# Patient Record
Sex: Female | Born: 1995 | Hispanic: No | Marital: Single | Smoking: Never smoker
Health system: Southern US, Community
[De-identification: ages and names within clinical notes are randomized; demographics above are authoritative.]

## PROBLEM LIST (undated history)

## (undated) HISTORY — PX: TONSILLECTOMY AND ADENOIDECTOMY: SUR1326

---

## 2015-07-20 ENCOUNTER — Encounter: Payer: Self-pay | Admitting: Urgent Care

## 2015-07-20 ENCOUNTER — Ambulatory Visit (INDEPENDENT_AMBULATORY_CARE_PROVIDER_SITE_OTHER): Payer: Commercial Managed Care - HMO | Admitting: Urgent Care

## 2015-07-20 ENCOUNTER — Ambulatory Visit (INDEPENDENT_AMBULATORY_CARE_PROVIDER_SITE_OTHER): Payer: Commercial Managed Care - HMO

## 2015-07-20 VITALS — BP 98/60 | HR 75 | Temp 97.4°F | Resp 16 | Ht 62.0 in | Wt 128.0 lb

## 2015-07-20 DIAGNOSIS — M542 Cervicalgia: Secondary | ICD-10-CM | POA: Diagnosis not present

## 2015-07-20 DIAGNOSIS — M6248 Contracture of muscle, other site: Secondary | ICD-10-CM | POA: Diagnosis not present

## 2015-07-20 DIAGNOSIS — M62838 Other muscle spasm: Secondary | ICD-10-CM

## 2015-07-20 MED ORDER — CYCLOBENZAPRINE HCL 5 MG PO TABS: 5.0000 mg | ORAL_TABLET | Freq: Three times a day (TID) | ORAL | Status: AC | PRN

## 2015-07-20 MED ORDER — MELOXICAM 7.5 MG PO TABS: 7.5000 mg | ORAL_TABLET | Freq: Every day | ORAL | Status: AC
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
## 2015-07-20 NOTE — Patient Instructions (Signed)

## 2018-04-23 DIAGNOSIS — R5383 Other fatigue: Secondary | ICD-10-CM | POA: Diagnosis not present

## 2018-04-23 DIAGNOSIS — Z7289 Other problems related to lifestyle: Secondary | ICD-10-CM | POA: Diagnosis not present

## 2018-04-23 DIAGNOSIS — R634 Abnormal weight loss: Secondary | ICD-10-CM | POA: Diagnosis not present

## 2018-06-11 DIAGNOSIS — Z01419 Encounter for gynecological examination (general) (routine) without abnormal findings: Secondary | ICD-10-CM | POA: Diagnosis not present

## 2018-06-26 DIAGNOSIS — G43809 Other migraine, not intractable, without status migrainosus: Secondary | ICD-10-CM | POA: Diagnosis not present

## 2018-09-07 DIAGNOSIS — R52 Pain, unspecified: Secondary | ICD-10-CM | POA: Diagnosis not present

## 2018-09-07 DIAGNOSIS — Z6821 Body mass index (BMI) 21.0-21.9, adult: Secondary | ICD-10-CM | POA: Diagnosis not present

## 2018-09-07 DIAGNOSIS — B349 Viral infection, unspecified: Secondary | ICD-10-CM | POA: Diagnosis not present

## 2018-11-28 DIAGNOSIS — N760 Acute vaginitis: Secondary | ICD-10-CM | POA: Diagnosis not present

## 2018-11-28 DIAGNOSIS — N926 Irregular menstruation, unspecified: Secondary | ICD-10-CM | POA: Diagnosis not present

## 2018-11-28 DIAGNOSIS — R309 Painful micturition, unspecified: Secondary | ICD-10-CM | POA: Diagnosis not present

## 2018-12-12 DIAGNOSIS — Z6821 Body mass index (BMI) 21.0-21.9, adult: Secondary | ICD-10-CM | POA: Diagnosis not present

## 2018-12-12 DIAGNOSIS — Z01419 Encounter for gynecological examination (general) (routine) without abnormal findings: Secondary | ICD-10-CM | POA: Diagnosis not present

## 2019-01-02 DIAGNOSIS — R87612 Low grade squamous intraepithelial lesion on cytologic smear of cervix (LGSIL): Secondary | ICD-10-CM | POA: Diagnosis not present

## 2019-08-05 ENCOUNTER — Ambulatory Visit: Payer: 59 | Attending: Internal Medicine

## 2019-08-05 DIAGNOSIS — Z20822 Contact with and (suspected) exposure to covid-19: Secondary | ICD-10-CM

## 2019-08-06 LAB — NOVEL CORONAVIRUS, NAA: SARS-CoV-2, NAA: NOT DETECTED

## 2020-06-08 ENCOUNTER — Other Ambulatory Visit (HOSPITAL_BASED_OUTPATIENT_CLINIC_OR_DEPARTMENT_OTHER): Payer: Self-pay | Admitting: Registered Nurse

## 2020-06-08 ENCOUNTER — Ambulatory Visit (HOSPITAL_BASED_OUTPATIENT_CLINIC_OR_DEPARTMENT_OTHER)
Admission: RE | Admit: 2020-06-08 | Discharge: 2020-06-08 | Disposition: A | Discharge status: Home / Self Care | Payer: No Typology Code available for payment source | Source: Ambulatory Visit | Attending: Registered Nurse | Admitting: Registered Nurse

## 2020-06-08 ENCOUNTER — Other Ambulatory Visit: Payer: Self-pay

## 2020-06-08 DIAGNOSIS — D72829 Elevated white blood cell count, unspecified: Secondary | ICD-10-CM | POA: Insufficient documentation

## 2020-06-08 DIAGNOSIS — R1031 Right lower quadrant pain: Secondary | ICD-10-CM

## 2020-06-08 MED ORDER — IOHEXOL 300 MG/ML  SOLN
100.0000 mL | Freq: Once | INTRAMUSCULAR | Status: AC | PRN
  Administered 2020-06-08: 100 mL via INTRAVENOUS

## 2021-03-23 ENCOUNTER — Other Ambulatory Visit (HOSPITAL_COMMUNITY): Payer: Self-pay | Admitting: Internal Medicine

## 2021-03-23 ENCOUNTER — Other Ambulatory Visit: Payer: Self-pay | Admitting: Internal Medicine

## 2021-03-23 ENCOUNTER — Ambulatory Visit (HOSPITAL_COMMUNITY)
Admission: RE | Admit: 2021-03-23 | Discharge: 2021-03-23 | Disposition: A | Discharge status: Home / Self Care | Payer: No Typology Code available for payment source | Source: Ambulatory Visit | Attending: Internal Medicine | Admitting: Internal Medicine

## 2021-03-23 DIAGNOSIS — H5319 Other subjective visual disturbances: Secondary | ICD-10-CM

## 2021-03-23 MED ORDER — GADOBUTROL 1 MMOL/ML IV SOLN
6.0000 mL | Freq: Once | INTRAVENOUS | Status: AC | PRN
  Administered 2021-03-23: 6 mL via INTRAVENOUS

## 2021-12-24 ENCOUNTER — Emergency Department (HOSPITAL_BASED_OUTPATIENT_CLINIC_OR_DEPARTMENT_OTHER)
Admission: EM | Admit: 2021-12-24 | Discharge: 2021-12-24 | Disposition: A | Discharge status: Home / Self Care | Payer: No Typology Code available for payment source | Attending: Emergency Medicine | Admitting: Emergency Medicine

## 2021-12-24 ENCOUNTER — Encounter (HOSPITAL_BASED_OUTPATIENT_CLINIC_OR_DEPARTMENT_OTHER): Payer: Self-pay

## 2021-12-24 ENCOUNTER — Other Ambulatory Visit: Payer: Self-pay

## 2021-12-24 DIAGNOSIS — R109 Unspecified abdominal pain: Secondary | ICD-10-CM

## 2021-12-24 DIAGNOSIS — L749 Eccrine sweat disorder, unspecified: Secondary | ICD-10-CM | POA: Insufficient documentation

## 2021-12-24 DIAGNOSIS — R1030 Lower abdominal pain, unspecified: Secondary | ICD-10-CM | POA: Insufficient documentation

## 2021-12-24 DIAGNOSIS — R61 Generalized hyperhidrosis: Secondary | ICD-10-CM

## 2021-12-24 DIAGNOSIS — R110 Nausea: Secondary | ICD-10-CM | POA: Diagnosis not present

## 2021-12-24 LAB — COMPREHENSIVE METABOLIC PANEL
ALT: 16 U/L (ref 0–44)
AST: 26 U/L (ref 15–41)
Albumin: 4.7 g/dL (ref 3.5–5.0)
Alkaline Phosphatase: 47 U/L (ref 38–126)
Anion gap: 11 (ref 5–15)
BUN: 10 mg/dL (ref 6–20)
CO2: 23 mmol/L (ref 22–32)
Calcium: 9.6 mg/dL (ref 8.9–10.3)
Chloride: 105 mmol/L (ref 98–111)
Creatinine, Ser: 0.62 mg/dL (ref 0.44–1.00)
GFR, Estimated: >60 mL/min (ref >60)
Glucose, Bld: 92 mg/dL (ref 70–99)
Potassium: 3.9 mmol/L (ref 3.5–5.1)
Sodium: 139 mmol/L (ref 135–145)
Total Bilirubin: 0.5 mg/dL (ref 0.3–1.2)
Total Protein: 7.6 g/dL (ref 6.5–8.1)

## 2021-12-24 LAB — URINALYSIS, ROUTINE W REFLEX MICROSCOPIC
Bilirubin Urine: NEGATIVE
Glucose, UA: NEGATIVE mg/dL
Ketones, ur: 15 mg/dL — AB
Leukocytes,Ua: NEGATIVE
Nitrite: NEGATIVE
Specific Gravity, Urine: 1.025 (ref 1.005–1.030)
pH: 6 (ref 5.0–8.0)

## 2021-12-24 LAB — CBC
HCT: 41.8 % (ref 36.0–46.0)
Hemoglobin: 14 g/dL (ref 12.0–15.0)
MCH: 31 pg (ref 26.0–34.0)
MCHC: 33.5 g/dL (ref 30.0–36.0)
MCV: 92.5 fL (ref 80.0–100.0)
Platelets: 235 10*3/uL (ref 150–400)
RBC: 4.52 MIL/uL (ref 3.87–5.11)
RDW: 12.1 % (ref 11.5–15.5)
WBC: 9 10*3/uL (ref 4.0–10.5)
nRBC: 0 % (ref 0.0–0.2)

## 2021-12-24 LAB — PREGNANCY, URINE: Preg Test, Ur: NEGATIVE

## 2021-12-24 NOTE — ED Triage Notes (Signed)
Pt reports waking up covered in sweat for 1.5 weeks and morning nausea. Pt reports similar symptoms with a previous UTI. Pt also reports intermittent mild right lower abd pain.  ?

## 2021-12-24 NOTE — ED Provider Notes (Addendum)
?[facility] ?Provider Note ? ? ?CSN: [id number] ?Arrival date & time: 12/24/21  1047 ? ?  ? ?History ? ?Chief Complaint  ?Patient presents with  ? Night Sweats  ? ? ?Yvette Gray is a 26 y.o. female who presents emergency department complaining of "night sweats" for the past week and a half.  Patient reports waking up covered in sweat with associated morning nausea.  She reports similar symptoms with previous UTIs.  She is also complaining of intermittent right lower abdominal pain.  Recently took fluconazole for vaginal yeast infection. ? ?The history is provided by the patient.  ? ?  ? ?Home Medications ?Prior to Admission medications   ?Medication Sig Start Date End Date Taking? Authorizing Provider  ?cyclobenzaprine (FLEXERIL) 5 MG tablet Take 1-2 tablets (5-10 mg total) by mouth 3 (three) times daily as needed for muscle spasms. 07/20/15   Jaynee Eagles, PA-C  ?LARIN FE 1/20 1-20 MG-MCG tablet Take 1 tablet by mouth daily. 12/19/21   [provider]  ?meloxicam (MOBIC) 7.5 MG tablet Take 1-2 tablets (7.5-15 mg total) by mouth daily. 07/20/15   Jaynee Eagles, PA-C  ?   ? ?Allergies    ?Naproxen   ? ?Review of Systems   ?Review of Systems  ?Constitutional:  Positive for diaphoresis. Negative for fever.  ?Respiratory:  Negative for shortness of breath.   ?Cardiovascular:  Negative for chest pain.  ?Gastrointestinal:  Positive for abdominal pain and nausea. Negative for blood in stool, diarrhea and vomiting.  ?Genitourinary:  Negative for dysuria, flank pain, hematuria, pelvic pain, urgency, vaginal discharge and vaginal pain.  ?Neurological:  Negative for syncope, light-headedness and headaches.  ?All other systems reviewed and are negative. ? ?Physical Exam ?Updated Vital Signs ?BP 119/82 (BP Location: Right Arm)   Pulse 76   Temp 98.8 ?F (37.1 ?C) (Oral)   Resp 15   Ht 5\' 2"  (1.575 m)   Wt 52.6 kg   SpO2 100%   BMI 21.22 kg/m?  ?Physical Exam ?Vitals and nursing note  reviewed.  ?Constitutional:   ?   Appearance: Normal appearance.  ?HENT:  ?   Head: Normocephalic and atraumatic.  ?Eyes:  ?   Conjunctiva/sclera: Conjunctivae normal.  ?Cardiovascular:  ?   Rate and Rhythm: Normal rate and regular rhythm.  ?Pulmonary:  ?   Effort: Pulmonary effort is normal. No respiratory distress.  ?   Breath sounds: Normal breath sounds.  ?Abdominal:  ?   General: There is no distension.  ?   Palpations: Abdomen is soft.  ?   Tenderness: There is no abdominal tenderness.  ?Skin: ?   General: Skin is warm and dry.  ?Neurological:  ?   General: No focal deficit present.  ?   Mental Status: She is alert.  ? ? ?ED Results / Procedures / Treatments   ?Labs ?(all labs ordered are listed, but only abnormal results are displayed) ?Labs Reviewed  ?URINALYSIS, ROUTINE W REFLEX MICROSCOPIC - Abnormal; Notable for the following components:  ?    Result Value  ? Hgb urine dipstick TRACE (*)   ? Ketones, ur 15 (*)   ? Protein, ur TRACE (*)   ? Bacteria, UA RARE (*)   ? All other components within normal limits  ?URINE CULTURE  ?PREGNANCY, URINE  ?CBC  ?COMPREHENSIVE METABOLIC PANEL  ? ? ?EKG ?None ? ?Radiology ?No results found. ? ?Procedures ?Procedures  ? ? ?Medications Ordered in ED ?Medications - No data to display ? ?ED  Course/ Medical Decision Making/ A&P ?  ?                        ?Medical Decision Making ?Amount and/or Complexity of Data Reviewed ?Labs: ordered. ? ?This patient is a 26 y.o. female who presents to the ED for concern of abdominal pain and night sweats, this involves an extensive number of treatment options, and is a complaint that carries with it a high risk of complications and morbidity. The emergent differential diagnosis prior to evaluation includes, but is not limited to,  AAA, mesenteric ischemia, appendicitis, diverticulitis, DKA, gastritis/gastroenteritis, nephrolithiasis, pancreatitis, peritonitis, constipation, UTI, SBO/LBO, biliary disease, IBD/IBS, PUD, hepatitis, ectopic  pregnancy, ovarian torsion, PID. This is not an exhaustive differential.  ? ?Past Medical History / Co-morbidities / Social History: ?History reviewed. No pertinent past medical history. ? ?Additional history: ?Chart reviewed.  ? ?Physical Exam: ?Physical exam performed. The pertinent findings include: Normal vital signs.  Abdomen soft, nontender.  Nonsurgical abdomen. ? ?Lab Tests: ?I ordered, and personally interpreted labs.  The pertinent results include: No leukocytosis, normal hemoglobin.  Urinalysis positive for trace hemoglobin, negative for leukocytes or nitrites.  Negative pregnancy. Urine sent for culture.  ?  ?Disposition: ?After consideration of the diagnostic results and the patients response to treatment, I feel that patient's not requiring admission and inpatient treatment for symptoms.  Discussed results of the blood work and urine testing with the patient.  While her urinalysis is negative for infection, will send for culture.  Patient plans to follow-up with her primary doctor for any persistence of her symptoms.  We discussed reasons to return to the emergency department, patient is agreeable to the plan. ? ?Final Clinical Impression(s) / ED Diagnoses ?Final diagnoses:  ?Abdominal discomfort  ?Night sweats  ? ? ?Rx / DC Orders ?ED Discharge Orders   ? ? None  ? ?  ? ?Portions of this report may have been transcribed using voice recognition software. Every effort was made to ensure accuracy; however, inadvertent computerized transcription errors may be present. ? ?  ?James Senn T, PA-C ?12/24/21 1357 ? ?  ?Hayden Rasmussen, MD ?12/24/21 1758 ? ?

## 2021-12-24 NOTE — Discharge Instructions (Addendum)
You were seen in the ER for sweats and possible UTI. ? ?As we discussed, your urine and lab work today looks reassuring. I have sent your urine for a culture, and if it is positive for bacteria, they will call you to prescribe you an antibiotic. ? ?I recommend following up with your primary doctor about any persistent symptoms. ? ?Have a safe vacation! ?

## 2021-12-25 LAB — URINE CULTURE: Culture: NO GROWTH

## 2022-02-08 IMAGING — MR MR HEAD WO/W CM
13 series · 48 of 48 positions shown · IV contrast (gadavist)
Comparison: None.

CLINICAL DATA: Initial evaluation for photopsia.

EXAM:
MRI HEAD WITHOUT AND WITH CONTRAST
TECHNIQUE: Multiplanar, multiecho pulse sequences of the brain and surrounding
structures were obtained without and with intravenous contrast.
CONTRAST:  6mL GADAVIST GADOBUTROL 1 MMOL/ML IV SOLN

[Series 5: DWI · axial · 3.0mm · 1.36mm/px · z∈[-69,+72]mm · 5 of 96 slices shown (1 of 2)]
[im 1/96]
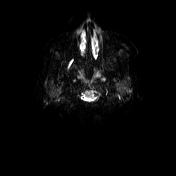
[im 24/96]
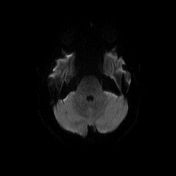
[im 48/96]
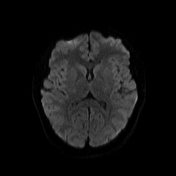
[im 72/96]
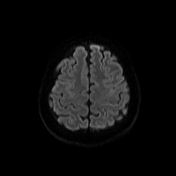
[im 96/96]
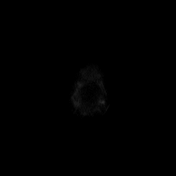

[Series 6: DWI · axial · 3.0mm · 1.36mm/px · z∈[-69,+72]mm · 3 of 48 slices shown (2 of 2)]
[im 1/48]
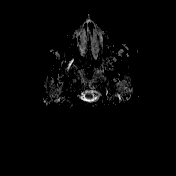
[im 24/48]
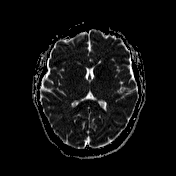
[im 48/48]
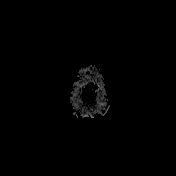

[Series 7: T1 · sagittal · 5.0mm · 0.75mm/px · 2 of 24 slices shown (1 of 2)]
[im 1/24]
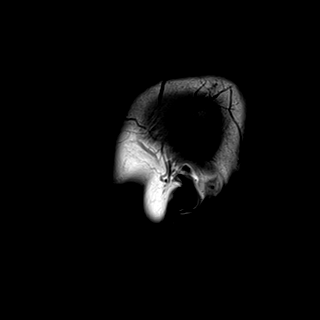
[im 24/24]
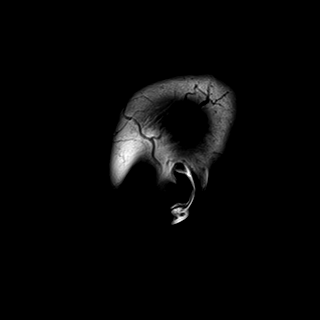

[Series 8: T2 · axial · 5.0mm · 0.62mm/px · z∈[-79,+83]mm · 2 of 26 slices shown]
[im 1/26]
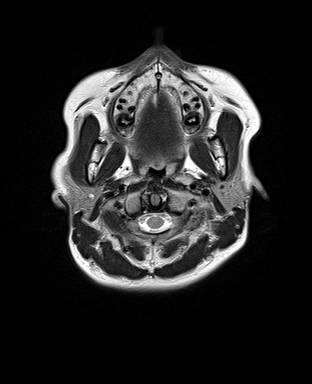
[im 26/26]
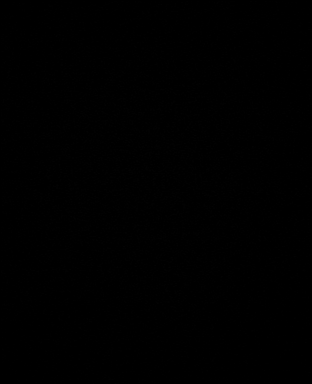

[Series 9: FLAIR · axial · 3.0mm · 0.75mm/px · z∈[-75,+78]mm · 3 of 52 slices shown]
[im 1/52]
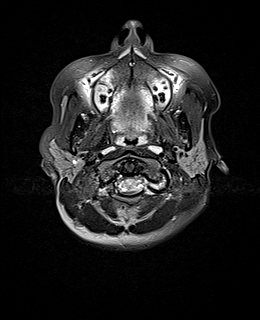
[im 26/52]
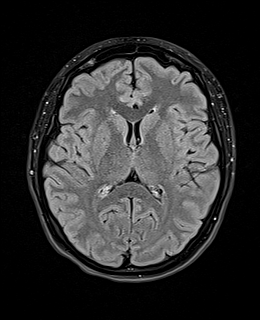
[im 52/52]
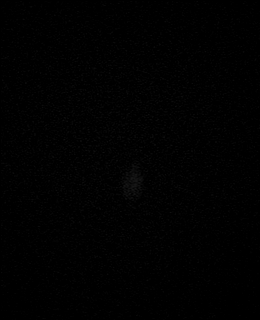

[Series 10: swi_images · axial · 3.0mm · 0.75mm/px · z∈[-87,+90]mm · 4 of 60 slices shown]
[im 1/60]
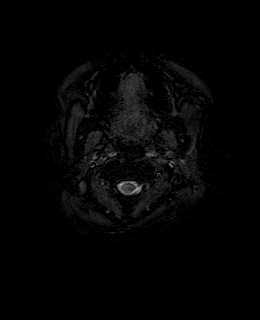
[im 20/60]
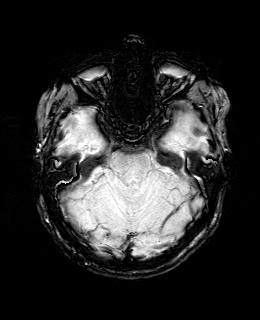
[im 40/60]
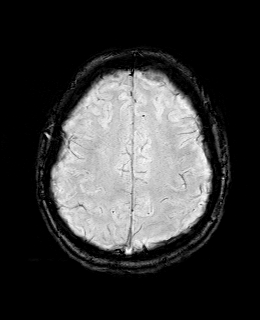
[im 60/60]
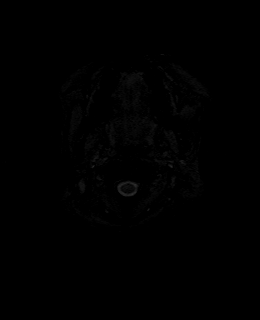

[Series 12: T1 · axial · 1.0mm · 0.94mm/px · z∈[-70,+73]mm · 9 of 144 slices shown (2 of 2)]
[im 1/144]
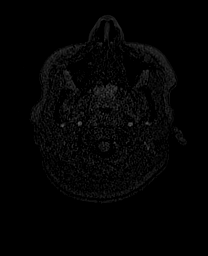
[im 18/144]
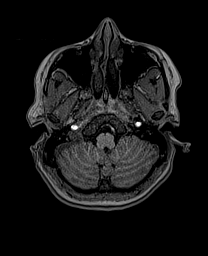
[im 36/144]
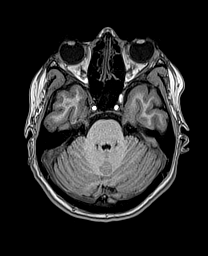
[im 54/144]
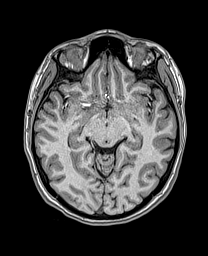
[im 72/144]
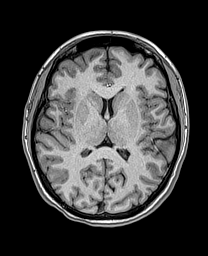
[im 90/144]
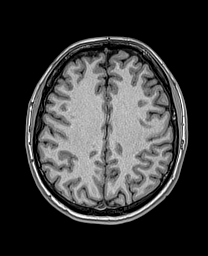
[im 108/144]
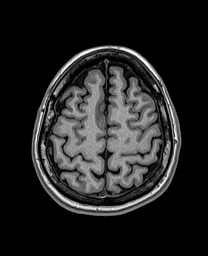
[im 126/144]
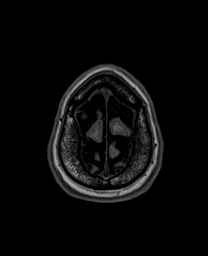
[im 144/144]
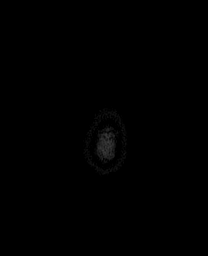

[Series 13: cor dwi_tracew · coronal · 5.0mm · 1.53mm/px · 3 of 52 slices shown]
[im 1/52]
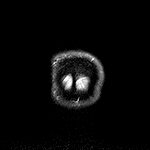
[im 26/52]
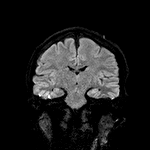
[im 52/52]
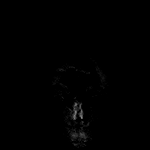

[Series 14: cor dwi_adc · coronal · 5.0mm · 1.53mm/px · 2 of 26 slices shown]
[im 1/26]
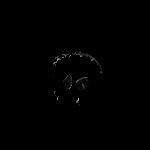
[im 26/26]
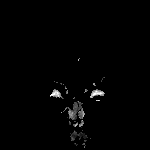

[Series 15: T2 post-contrast · coronal · 5.0mm · 0.57mm/px · 2 of 29 slices shown]
[im 1/29]
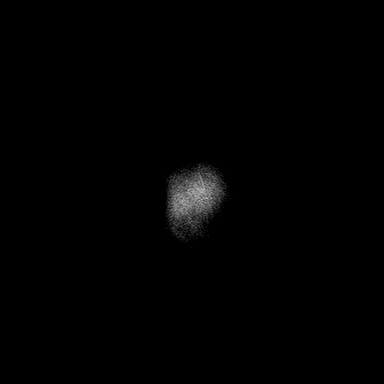
[im 29/29]
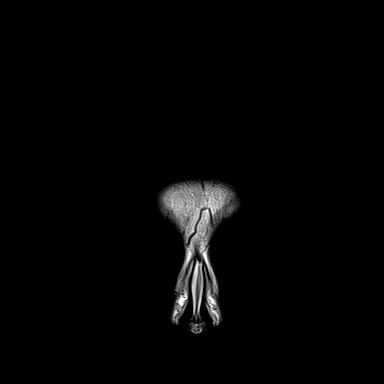

[Series 16: T1 post-contrast · axial · 1.0mm · 0.94mm/px · z∈[-70,+73]mm · 9 of 144 slices shown (1 of 3)]
[im 1/144]
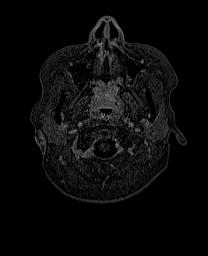
[im 18/144]
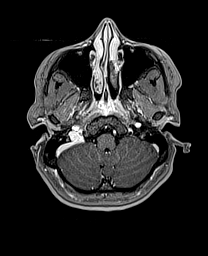
[im 36/144]
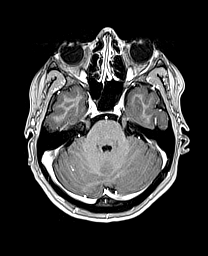
[im 54/144]
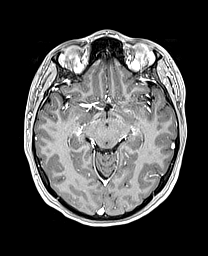
[im 72/144]
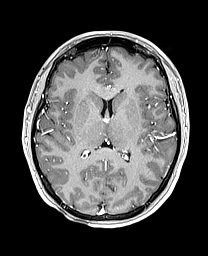
[im 90/144]
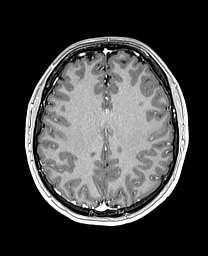
[im 108/144]
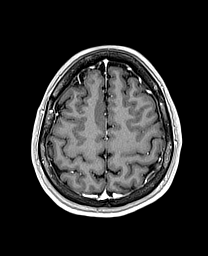
[im 126/144]
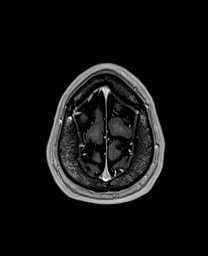
[im 144/144]
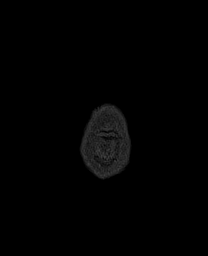

[Series 17: T1 post-contrast · coronal · 5.0mm · 0.43mm/px · 2 of 29 slices shown (2 of 3)]
[im 1/29]
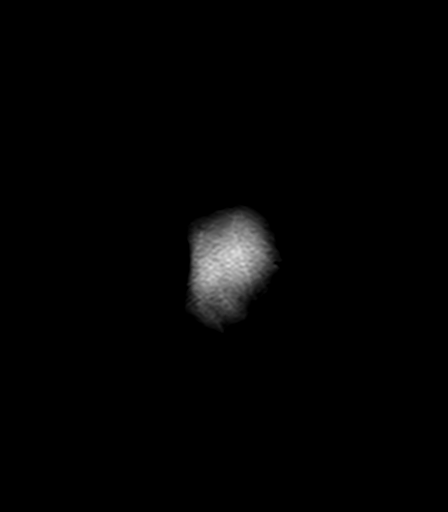
[im 29/29]
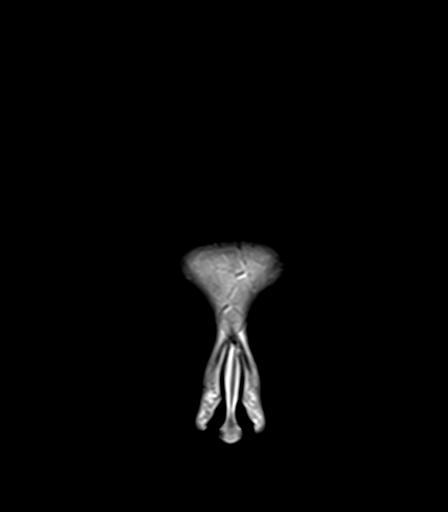

[Series 18: T1 post-contrast · sagittal · 5.0mm · 0.75mm/px · 2 of 24 slices shown (3 of 3)]
[im 1/24]
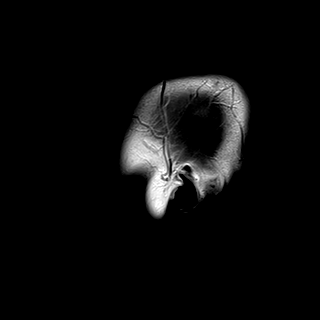
[im 24/24]
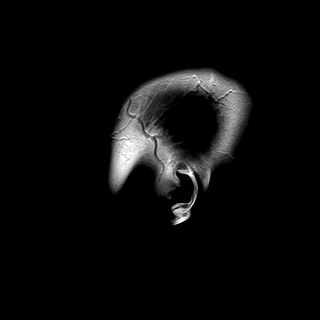

[48 of 48 positions shown; findings below may reference images not displayed]

FINDINGS: Brain: Cerebral volume within normal limits for patient age. No
focal parenchymal signal abnormality identified.

No abnormal foci of restricted diffusion to suggest acute or
subacute ischemia. Gray-white matter differentiation well
maintained. No encephalomalacia to suggest chronic infarction. No
foci of susceptibility artifact to suggest acute or chronic
intracranial hemorrhage.

No mass lesion, midline shift or mass effect. No hydrocephalus. No
extra-axial fluid collection. Major dural sinuses are grossly
patent.

Pituitary gland and suprasellar region are normal. Midline
structures intact and normal.

No abnormal enhancement. Incidental note made of a small right
cerebellar DVA.

Vascular: Major intracranial vascular flow voids well maintained and
normal in appearance.

Skull and upper cervical spine: Craniocervical junction normal.
Visualized upper cervical spine within normal limits. Bone marrow
signal intensity normal. No scalp soft tissue abnormality.

Sinuses/Orbits: Globes and orbital soft tissues within normal
limits.

Small right maxillary sinus retention cyst. Paranasal sinuses are
otherwise clear. No mastoid effusion. Inner ear structures grossly
normal.

Other: None.
IMPRESSION: Normal brain MRI. No acute intracranial abnormality identified.

## 2023-07-29 ENCOUNTER — Other Ambulatory Visit: Payer: Self-pay

## 2023-07-29 DIAGNOSIS — R5383 Other fatigue: Secondary | ICD-10-CM | POA: Insufficient documentation

## 2023-07-29 DIAGNOSIS — Z1152 Encounter for screening for COVID-19: Secondary | ICD-10-CM | POA: Insufficient documentation

## 2023-07-29 DIAGNOSIS — R946 Abnormal results of thyroid function studies: Secondary | ICD-10-CM | POA: Insufficient documentation

## 2023-07-29 DIAGNOSIS — R1084 Generalized abdominal pain: Secondary | ICD-10-CM | POA: Insufficient documentation

## 2023-07-29 LAB — URINALYSIS, ROUTINE W REFLEX MICROSCOPIC
Bilirubin Urine: NEGATIVE
Glucose, UA: NEGATIVE mg/dL
Hgb urine dipstick: NEGATIVE
Ketones, ur: NEGATIVE mg/dL
Leukocytes,Ua: NEGATIVE
Nitrite: NEGATIVE
Protein, ur: NEGATIVE mg/dL
Specific Gravity, Urine: 1.008 (ref 1.005–1.030)
pH: 6.5 (ref 5.0–8.0)

## 2023-07-29 LAB — RESP PANEL BY RT-PCR (RSV, FLU A&B, COVID)  RVPGX2
Influenza A by PCR: NEGATIVE
Influenza B by PCR: NEGATIVE
Resp Syncytial Virus by PCR: NEGATIVE
SARS Coronavirus 2 by RT PCR: NEGATIVE

## 2023-07-29 LAB — PREGNANCY, URINE: Preg Test, Ur: NEGATIVE

## 2023-07-29 NOTE — ED Triage Notes (Signed)
Pt to triage c/o generalized body aches chills x 2 days. Tylenol last admin 21:00.  Pt VSS NAD Pt on room air.

## 2023-07-30 ENCOUNTER — Other Ambulatory Visit: Payer: Self-pay

## 2023-07-30 ENCOUNTER — Emergency Department (HOSPITAL_BASED_OUTPATIENT_CLINIC_OR_DEPARTMENT_OTHER): Payer: Self-pay | Admitting: Radiology

## 2023-07-30 ENCOUNTER — Emergency Department (HOSPITAL_BASED_OUTPATIENT_CLINIC_OR_DEPARTMENT_OTHER)
Admission: EM | Admit: 2023-07-30 | Discharge: 2023-07-30 | Disposition: A | Discharge status: Home / Self Care | Payer: Self-pay | Attending: Emergency Medicine | Admitting: Emergency Medicine

## 2023-07-30 DIAGNOSIS — R5383 Other fatigue: Secondary | ICD-10-CM

## 2023-07-30 DIAGNOSIS — R7989 Other specified abnormal findings of blood chemistry: Secondary | ICD-10-CM

## 2023-07-30 LAB — CBC WITH DIFFERENTIAL/PLATELET
Abs Immature Granulocytes: 0.02 10*3/uL (ref 0.00–0.07)
Basophils Absolute: 0 10*3/uL (ref 0.0–0.1)
Basophils Relative: 0 %
Eosinophils Absolute: 0.1 10*3/uL (ref 0.0–0.5)
Eosinophils Relative: 1 %
HCT: 43 % (ref 36.0–46.0)
Hemoglobin: 14.6 g/dL (ref 12.0–15.0)
Immature Granulocytes: 0 %
Lymphocytes Relative: 41 %
Lymphs Abs: 4 10*3/uL (ref 0.7–4.0)
MCH: 31.3 pg (ref 26.0–34.0)
MCHC: 34 g/dL (ref 30.0–36.0)
MCV: 92.1 fL (ref 80.0–100.0)
Monocytes Absolute: 0.6 10*3/uL (ref 0.1–1.0)
Monocytes Relative: 6 %
Neutro Abs: 5.2 10*3/uL (ref 1.7–7.7)
Neutrophils Relative %: 52 %
Platelets: 278 10*3/uL (ref 150–400)
RBC: 4.67 MIL/uL (ref 3.87–5.11)
RDW: 12.5 % (ref 11.5–15.5)
WBC: 9.9 10*3/uL (ref 4.0–10.5)
nRBC: 0 % (ref 0.0–0.2)

## 2023-07-30 LAB — COMPREHENSIVE METABOLIC PANEL
ALT: 12 U/L (ref 0–44)
AST: 25 U/L (ref 15–41)
Albumin: 4.8 g/dL (ref 3.5–5.0)
Alkaline Phosphatase: 66 U/L (ref 38–126)
Anion gap: 9 (ref 5–15)
BUN: 10 mg/dL (ref 6–20)
CO2: 24 mmol/L (ref 22–32)
Calcium: 9.8 mg/dL (ref 8.9–10.3)
Chloride: 103 mmol/L (ref 98–111)
Creatinine, Ser: 0.63 mg/dL (ref 0.44–1.00)
GFR, Estimated: >60 mL/min (ref >60)
Glucose, Bld: 103 mg/dL — ABNORMAL HIGH (ref 70–99)
Potassium: 3.9 mmol/L (ref 3.5–5.1)
Sodium: 136 mmol/L (ref 135–145)
Total Bilirubin: 0.4 mg/dL (ref <1.2)
Total Protein: 7.7 g/dL (ref 6.5–8.1)

## 2023-07-30 LAB — D-DIMER, QUANTITATIVE: D-Dimer, Quant: <0.27 ug{FEU}/mL (ref 0.00–0.50)

## 2023-07-30 LAB — T4, FREE: Free T4: 0.96 ng/dL (ref 0.61–1.12)

## 2023-07-30 LAB — TSH: TSH: 5.573 u[IU]/mL — ABNORMAL HIGH (ref 0.350–4.500)

## 2023-07-30 LAB — TROPONIN I (HIGH SENSITIVITY): Troponin I (High Sensitivity): <2 ng/L (ref <18)

## 2023-07-30 MED ORDER — LACTATED RINGERS IV BOLUS
1000.0000 mL | Freq: Once | INTRAVENOUS | Status: AC
  Administered 2023-07-30: 1000 mL via INTRAVENOUS

## 2023-07-30 NOTE — ED Provider Notes (Signed)
[place] EMERGENCY DEPARTMENT AT [facility] Provider Note   CSN: [id number] Arrival date & time: 07/29/23  2142     History  Chief Complaint  Patient presents with   Generalized Body Aches    Yvette Gray is a 27 y.o. female.  Patient has not been feeling well for the past 1 month.  She reports generalized weakness, lightheadedness, intermittent hot flashes.  Especially worse over the past 2 days.  She reports feeling flushed, hot, lightheaded.  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  May not be eating and drinking enough.  Denies any chest pain or shortness of breath.  Denies any abdominal pain, nausea, vomiting, pain with urination or blood in the urine.  States she felt warm but did not check her temperature.  She had aches and chills and hot flashes and it measured her blood pressure at home was was concerned because it was 149 systolic.  Does not have a history of hypertension.  Only medication is birth control. No focal weakness, numbness or tingling. No cough, runny nose, sore throat, nausea, vomiting, diarrhea.  No abdominal pain currently.  Reports having 2 UTIs in the past 1 month.  No UTI symptoms today. No headache or visual change  The history is provided by the patient and a parent.       Home Medications Prior to Admission medications   Medication Sig Start Date End Date Taking? Authorizing Provider  cyclobenzaprine (FLEXERIL) 5 MG tablet Take 1-2 tablets (5-10 mg total) by mouth 3 (three) times daily as needed for muscle spasms. 07/20/15   Wallis Bamberg, PA-C  LARIN FE 1/20 1-20 MG-MCG tablet Take 1 tablet by mouth daily. 12/19/21   [provider]  meloxicam (MOBIC) 7.5 MG tablet Take 1-2 tablets (7.5-15 mg total) by mouth daily. 07/20/15   Wallis Bamberg, PA-C      Allergies    Naproxen    Review of Systems   Review of Systems  Constitutional:  Positive for fatigue. Negative for activity change,  appetite change and fever.  HENT:  Negative for congestion, rhinorrhea and sore throat.   Respiratory:  Negative for cough, chest tightness and shortness of breath.   Cardiovascular:  Negative for chest pain.  Gastrointestinal:  Negative for abdominal pain, nausea and vomiting.  Genitourinary:  Negative for dysuria and hematuria.  Musculoskeletal:  Positive for arthralgias and myalgias.  Skin:  Negative for wound.  Neurological:  Positive for weakness and light-headedness. Negative for dizziness and headaches.   all other systems are negative except as noted in the HPI and PMH.    Physical Exam Updated Vital Signs BP 132/77 (BP Location: Left Arm)   Pulse 88   Temp 98.2 F (36.8 C) (Oral)   Resp 18   Wt 57.2 kg   LMP  (LMP Unknown)   SpO2 99%   BMI 23.05 kg/m  Physical Exam Vitals and nursing note reviewed.  Constitutional:      General: She is not in acute distress.    Appearance: She is well-developed. She is not ill-appearing.  HENT:     Head: Normocephalic and atraumatic.     Mouth/Throat:     Pharynx: No oropharyngeal exudate.  Eyes:     Conjunctiva/sclera: Conjunctivae normal.     Pupils: Pupils are equal, round, and reactive to light.  Neck:     Comments: No meningismus. Cardiovascular:     Rate and Rhythm: Normal rate and regular  rhythm.     Heart sounds: Normal heart sounds. No murmur heard. Pulmonary:     Effort: Pulmonary effort is normal. No respiratory distress.     Breath sounds: Normal breath sounds.  Abdominal:     Palpations: Abdomen is soft.     Tenderness: There is no abdominal tenderness. There is no guarding or rebound.  Musculoskeletal:        General: No tenderness. Normal range of motion.     Cervical back: Normal range of motion and neck supple.  Skin:    General: Skin is warm.  Neurological:     Mental Status: She is alert and oriented to person, place, and time.     Cranial Nerves: No cranial nerve deficit.     Motor: No abnormal  muscle tone.     Coordination: Coordination normal.     Comments:  5/5 strength throughout. CN 2-12 intact.Equal grip strength.   Psychiatric:        Behavior: Behavior normal.     ED Results / Procedures / Treatments   Labs (all labs ordered are listed, but only abnormal results are displayed) Labs Reviewed  URINALYSIS, ROUTINE W REFLEX MICROSCOPIC - Abnormal; Notable for the following components:      Result Value   Color, Urine COLORLESS (*)    All other components within normal limits  COMPREHENSIVE METABOLIC PANEL - Abnormal; Notable for the following components:   Glucose, Bld 103 (*)    All other components within normal limits  TSH - Abnormal; Notable for the following components:   TSH 5.573 (*)    All other components within normal limits  RESP PANEL BY RT-PCR (RSV, FLU A&B, COVID)  RVPGX2  PREGNANCY, URINE  CBC WITH DIFFERENTIAL/PLATELET  D-DIMER, QUANTITATIVE (NOT AT [facility])  T4, FREE  TROPONIN I (HIGH SENSITIVITY)    EKG None  Radiology DG Chest 2 View Result Date: 07/30/2023 CLINICAL DATA:  Weakness and cough, generalized body aches and chills EXAM: CHEST - 2 VIEW COMPARISON:  None Available. FINDINGS: The heart size and mediastinal contours are within normal limits. Both lungs are clear. The visualized skeletal structures are unremarkable. IMPRESSION: No active cardiopulmonary disease. Electronically Signed   By: Minerva Fester M.D.   On: 07/30/2023 01:14    Procedures Procedures    Medications Ordered in ED Medications  lactated ringers bolus 1,000 mL (has no administration in time range)    ED Course/ Medical Decision Making/ A&P                                 Medical Decision Making Amount and/or Complexity of Data Reviewed Independent Historian: parent Labs: ordered. Decision-making details documented in ED Course. Radiology: ordered and independent interpretation performed. Decision-making details documented in ED Course. ECG/medicine  tests: ordered and independent interpretation performed. Decision-making details documented in ED Course.   Nonspecific fatigue, weakness, lightheadedness x 1 month.  Vital stable on arrival.  Neurological exam is nonfocal.  She appears well.  Will obtain EKG, basic labs.  Urinalysis is negative.  hCG is negative.  COVID and flu swabs are negative in triage.  Workup reassuring.  No significant anemia.  Electrolytes are stable.  TSH slightly elevated at 5.5.  Will send free T4.  Chest x-ray is negative for infiltrate or pneumonia.  Orthostatics are negative.  EKG is sinus rhythm.  No prolonged QT, no Brugada.  Patient feels improved after IV fluids.  She  is tolerating p.o. and ambulatory.  Denies any complaints.  No headache, chest pain, shortness of breath.  Discussed her elevated TSH and need for follow-up for her free T4 with her PCP Dr. Clelia Croft.  Discussed she can check her free T4 online later today and if it is low she should follow-up with Dr. Clelia Croft for consideration of initiation of thyroid medications.  Return precautions discussed. Including chest pain, exertional shortness of breath or other concerns.        Final Clinical Impression(s) / ED Diagnoses Final diagnoses:  Fatigue, unspecified type  Elevated TSH    Rx / DC Orders ED Discharge Orders     None         Tamara Kenyon, Jeannett Senior, MD 07/30/23 [phone]

## 2023-07-30 NOTE — Discharge Instructions (Signed)
Your testing is reassuring.  Keep yourself hydrated.  Your TSH is slightly elevated which means your thyroid function could be low.  Test called free T4 is pending.  If this is low you may require thyroid medication.  Follow-up with Dr. Clelia Croft to discuss this.  Return to the ED with chest pain, difficulty breathing, unilateral weakness, numbness, tingling or other concerns.

## 2023-08-26 ENCOUNTER — Emergency Department (HOSPITAL_BASED_OUTPATIENT_CLINIC_OR_DEPARTMENT_OTHER)
Admission: EM | Admit: 2023-08-26 | Discharge: 2023-08-26 | Disposition: A | Discharge status: Home / Self Care | Payer: Self-pay | Attending: Emergency Medicine | Admitting: Emergency Medicine

## 2023-08-26 ENCOUNTER — Encounter (HOSPITAL_BASED_OUTPATIENT_CLINIC_OR_DEPARTMENT_OTHER): Payer: Self-pay

## 2023-08-26 ENCOUNTER — Other Ambulatory Visit: Payer: Self-pay

## 2023-08-26 DIAGNOSIS — I10 Essential (primary) hypertension: Secondary | ICD-10-CM | POA: Insufficient documentation

## 2023-08-26 DIAGNOSIS — R030 Elevated blood-pressure reading, without diagnosis of hypertension: Secondary | ICD-10-CM

## 2023-08-26 DIAGNOSIS — M542 Cervicalgia: Secondary | ICD-10-CM | POA: Insufficient documentation

## 2023-08-26 DIAGNOSIS — Z79899 Other long term (current) drug therapy: Secondary | ICD-10-CM | POA: Insufficient documentation

## 2023-08-26 DIAGNOSIS — E039 Hypothyroidism, unspecified: Secondary | ICD-10-CM | POA: Insufficient documentation

## 2023-08-26 LAB — CBC
HCT: 44.1 % (ref 36.0–46.0)
Hemoglobin: 15.3 g/dL — ABNORMAL HIGH (ref 12.0–15.0)
MCH: 31.8 pg (ref 26.0–34.0)
MCHC: 34.7 g/dL (ref 30.0–36.0)
MCV: 91.7 fL (ref 80.0–100.0)
Platelets: 278 10*3/uL (ref 150–400)
RBC: 4.81 MIL/uL (ref 3.87–5.11)
RDW: 12.5 % (ref 11.5–15.5)
WBC: 7.9 10*3/uL (ref 4.0–10.5)
nRBC: 0 % (ref 0.0–0.2)

## 2023-08-26 LAB — BASIC METABOLIC PANEL
Anion gap: 11 (ref 5–15)
BUN: 7 mg/dL (ref 6–20)
CO2: 24 mmol/L (ref 22–32)
Calcium: 9.7 mg/dL (ref 8.9–10.3)
Chloride: 105 mmol/L (ref 98–111)
Creatinine, Ser: 0.57 mg/dL (ref 0.44–1.00)
GFR, Estimated: >60 mL/min (ref >60)
Glucose, Bld: 89 mg/dL (ref 70–99)
Potassium: 4 mmol/L (ref 3.5–5.1)
Sodium: 140 mmol/L (ref 135–145)

## 2023-08-26 LAB — TROPONIN I (HIGH SENSITIVITY): Troponin I (High Sensitivity): <2 ng/L (ref <18)

## 2023-08-26 MED ORDER — KETOROLAC TROMETHAMINE 15 MG/ML IJ SOLN
15.0000 mg | Freq: Once | INTRAMUSCULAR | Status: AC
  Administered 2023-08-26: 15 mg via INTRAVENOUS
  Filled 2023-08-26: qty 1

## 2023-08-26 MED ORDER — LIDOCAINE 5 % EX PTCH
1.0000 | MEDICATED_PATCH | CUTANEOUS | Status: DC
  Administered 2023-08-26: 1 via TRANSDERMAL
  Filled 2023-08-26: qty 1

## 2023-08-26 MED ORDER — CELECOXIB 200 MG PO CAPS: 200.0000 mg | ORAL_CAPSULE | Freq: Two times a day (BID) | ORAL | 0 refills | Status: AC | PRN

## 2023-08-26 NOTE — ED Triage Notes (Signed)
 Pt reports headache pain at base of skull yesterday which prompted her to check her BP at home - which she found to be 149/80.  She called her PMD and was advised to take some of her mother's BP medication and recheck it.  She reports her BP lowered to 136/74.  This morning she checked again and it was 144/75 and she was advised to report to UC for eval.  Pt reports left shoulder and arm pain, along with left lower base of skull headache and queesy stomach.

## 2023-08-26 NOTE — Discharge Instructions (Signed)
 As discussed, will send you home with the same medicines we gave you on the emergency department for treatment of your neck pain.  Again, suspect this is like related to muscular spasm/strain.  Regarding her blood pressure, continue to log your blood pressure at home.  I do not think that you necessarily need emergent blood pressure management at this time but continue to track it at home.  Keep upcoming appointment on Monday with your primary care for further discussion regarding blood pressure management.  Please do not hesitate to return if the worrisome signs and symptoms we discussed become apparent.

## 2023-08-26 NOTE — ED Notes (Signed)
 Pt alert and oriented X 4 at the time of discharge. RR even and unlabored. No acute distress noted. Pt verbalized understanding of discharge instructions as discussed. Pt ambulatory to lobby at time of discharge.

## 2023-08-26 NOTE — ED Provider Notes (Signed)
 [place] EMERGENCY DEPARTMENT AT [facility] Provider Note   CSN: [id number] Arrival date & time: 08/26/23  1128     History  Chief Complaint  Patient presents with   Hypertension    Yvette Gray is a 28 y.o. female.  HPI   28 year old female presents emergency department with complaints of elevated blood pressure, left-sided neck pain.  Patient reports headache from the base of her left neck down to her left shoulder that began yesterday afternoon.  States he took her blood pressure at that time was elevated 149/80.  Serially took her blood pressures and it was in the upper 130s/140s.  Called her primary care told her to take her mother's olmesartan which she did without improvement of headache prompting visit to the emergency department.  Denies any visual symptoms, gait abnormality, slurred speech, facial droop, weakness and sensory deficits in upper lower extremities.  Denies any chest pain, shortness of breath.  Reports pain worsened with movement and relieved with rest.  Says that she was laying down on her bed looking at her phone when the symptoms began.  Past medical history significant for chronic constipation, acquired hypothyroidism Home Medications Prior to Admission medications   Medication Sig Start Date End Date Taking? Authorizing Provider  celecoxib  (CELEBREX ) 200 MG capsule Take 1 capsule (200 mg total) by mouth 2 (two) times daily as needed. 08/26/23  Yes Silver Fell A, PA  cyclobenzaprine  (FLEXERIL ) 5 MG tablet Take 1-2 tablets (5-10 mg total) by mouth 3 (three) times daily as needed for muscle spasms. 07/20/15   Christopher Savannah, PA-C  LARIN FE 1/20 1-20 MG-MCG tablet Take 1 tablet by mouth daily. 12/19/21   [provider]  meloxicam  (MOBIC ) 7.5 MG tablet Take 1-2 tablets (7.5-15 mg total) by mouth daily. 07/20/15   Christopher Savannah, PA-C      Allergies    Naproxen    Review of Systems   Review of Systems  All other systems reviewed and are  negative.   Physical Exam Updated Vital Signs BP 115/71   Pulse 78   Temp 98.5 F (36.9 C) (Oral)   Resp 17   LMP  (LMP Unknown)   SpO2 98%  Physical Exam Vitals and nursing note reviewed.  Constitutional:      General: She is not in acute distress.    Appearance: She is well-developed.  HENT:     Head: Normocephalic and atraumatic.  Eyes:     Conjunctiva/sclera: Conjunctivae normal.  Cardiovascular:     Rate and Rhythm: Normal rate and regular rhythm.     Heart sounds: No murmur heard. Pulmonary:     Effort: Pulmonary effort is normal. No respiratory distress.     Breath sounds: Normal breath sounds. No wheezing, rhonchi or rales.  Abdominal:     Palpations: Abdomen is soft.     Tenderness: There is no abdominal tenderness.  Musculoskeletal:        General: No swelling.     Cervical back: Neck supple.     Comments: Tender to palpation left paraspinally cervical region with extension down left trapezial ridge.  Full range of motion of neck.  Radial pulses 2+ bilaterally.  Full range of motion of bilateral upper extremities.  Skin:    General: Skin is warm and dry.     Capillary Refill: Capillary refill takes less than 2 seconds.  Neurological:     Mental Status: She is alert.  Psychiatric:        Mood  and Affect: Mood normal.     ED Results / Procedures / Treatments   Labs (all labs ordered are listed, but only abnormal results are displayed) Labs Reviewed  CBC - Abnormal; Notable for the following components:      Result Value   Hemoglobin 15.3 (*)    All other components within normal limits  BASIC METABOLIC PANEL  TROPONIN I (HIGH SENSITIVITY)    EKG EKG Interpretation Date/Time:  Saturday August 26 2023 11:42:06 EST Ventricular Rate:  99 PR Interval:  103 QRS Duration:  81 QT Interval:  329 QTC Calculation: 423 R Axis:   95  Text Interpretation: Right and left arm electrode reversal, interpretation assumes no reversal Sinus rhythm Borderline  right axis deviation Abnormal T, consider ischemia, lateral leads No significant change since last tracing Confirmed by Zackowski, Scott [phone]) on 08/26/2023 11:47:21 AM  Radiology No results found.  Procedures Procedures    Medications Ordered in ED Medications  lidocaine  (LIDODERM ) 5 % 1 patch (1 patch Transdermal Patch Applied 08/26/23 1221)  ketorolac  (TORADOL ) 15 MG/ML injection 15 mg (15 mg Intravenous Given 08/26/23 1220)    ED Course/ Medical Decision Making/ A&P                                 Medical Decision Making Amount and/or Complexity of Data Reviewed Labs: ordered.  Risk Prescription drug management.   This patient presents to the ED for concern of left neck pain, elevated blood pressure, this involves an extensive number of treatment options, and is a complaint that carries with it a high risk of complications and morbidity.  The differential diagnosis includes hypertension, ACS, muscular strain, migraine/tension headache, arterial dissection, meningitis, thoracic outlet syndrome, other   Co morbidities that complicate the patient evaluation  See HPI   Additional history obtained:  Additional history obtained from EMR External records from outside source obtained and reviewed including hospital records   Lab Tests:  I Ordered, and personally interpreted labs.  The pertinent results include: No leukocytosis.  Polycythemia hemoglobin 15.3.  Platelets within normal range.  Troponin negative.  No electrolyte abnormalities.  No renal dysfunction.   Imaging Studies ordered:  N/a   Cardiac Monitoring: / EKG:  The patient was maintained on a cardiac monitor.  I personally viewed and interpreted the cardiac monitored which showed an underlying rhythm of: Sinus rhythm   Consultations Obtained:  N/a   Problem List / ED Course / Critical interventions / Medication management  Left neck pain I ordered medication including Toradol , Lidoderm     Reevaluation of the patient after these medicines showed that the patient improved I have reviewed the patients home medicines and have made adjustments as needed   Social Determinants of Health:  Denies tobacco, illicit drug use   Test / Admission - Considered:  Left neck pain, elevated blood pressure reading Vitals signs significant for blood pressure 138/92. Otherwise within normal range and stable throughout visit. Laboratory studies significant for: See above 28 year old female presents emergency department with complaints of elevated blood pressure as well as left-sided neck pain.  Regarding neck pain, present yesterday afternoon when she is laying on her bed looking at her phone.  Reproducible tenderness paraspinally left cervical region as well as left trapezial ridge.  No evidence of meningismus, fever; low suspicion for meningitis.  No pulse deficits suggest ischemic limb.  No upper extremity swelling concerning for DVT.  No overlying skin  changes concerning for secondary infectious process.  Suspect muscular etiology of patient's symptoms.  Treated with Toradol  in the ED and did note improvement reaching resolution of symptoms.  Will send home with NSAID use as needed for pain.  Regarding elevated blood pressure, did have persistently elevated blood pressures in the outpatient setting yesterday in the 140s/130s.  Patient with initial reading with slight hypertension blood pressure 138 systolic with subsequent readings through patient's 2+ hour stay in the ED normotensive.  Will refrain from treatment of patient's hypertension at this time but will ask patient to log her blood pressure and follow-up with primary care for further conversation regarding hypertension management.  Treatment plan discussed at length with patient and she acknowledged understanding was agreeable to said plan.  Patient overall well-appearing, afebrile in no acute distress. Worrisome signs and symptoms were  discussed with the patient, and the patient acknowledged understanding to return to the ED if noticed. Patient was stable upon discharge.          Final Clinical Impression(s) / ED Diagnoses Final diagnoses:  Neck pain on left side  Elevated blood pressure reading    Rx / DC Orders ED Discharge Orders          Ordered    celecoxib  (CELEBREX ) 200 MG capsule  2 times daily PRN        08/26/23 1328              Silver Wonda LABOR, [place] 08/26/23 1635    Geraldene Hamilton, MD 08/27/23 [phone]

## 2023-12-04 ENCOUNTER — Encounter (HOSPITAL_BASED_OUTPATIENT_CLINIC_OR_DEPARTMENT_OTHER): Payer: Self-pay | Admitting: Certified Nurse Midwife
